# Patient Record
Sex: Female | Born: 1935
Health system: Southern US, Community
[De-identification: ages and names within clinical notes are randomized; demographics above are authoritative.]

## PROBLEM LIST (undated history)

## (undated) DIAGNOSIS — I10 Essential (primary) hypertension: Secondary | ICD-10-CM

## (undated) HISTORY — PX: BACK SURGERY: SHX140

## (undated) HISTORY — PX: TONSILLECTOMY: SUR1361

## (undated) HISTORY — PX: ABDOMINAL HYSTERECTOMY: SHX81

## (undated) HISTORY — PX: ABDOMINAL SURGERY: SHX537

## (undated) HISTORY — PX: CHOLECYSTECTOMY: SHX55

---

## 2016-10-09 DIAGNOSIS — I639 Cerebral infarction, unspecified: Secondary | ICD-10-CM

## 2016-10-09 HISTORY — DX: Cerebral infarction, unspecified: I63.9

## 2020-02-25 ENCOUNTER — Other Ambulatory Visit: Payer: Self-pay

## 2020-02-25 ENCOUNTER — Emergency Department (HOSPITAL_BASED_OUTPATIENT_CLINIC_OR_DEPARTMENT_OTHER): Payer: Medicare Other

## 2020-02-25 ENCOUNTER — Emergency Department (HOSPITAL_BASED_OUTPATIENT_CLINIC_OR_DEPARTMENT_OTHER)
Admission: EM | Admit: 2020-02-25 | Discharge: 2020-02-25 | Disposition: A | Discharge status: Home / Self Care | Payer: Medicare Other | Attending: Emergency Medicine | Admitting: Emergency Medicine

## 2020-02-25 ENCOUNTER — Encounter (HOSPITAL_BASED_OUTPATIENT_CLINIC_OR_DEPARTMENT_OTHER): Payer: Self-pay | Admitting: Emergency Medicine

## 2020-02-25 DIAGNOSIS — Z8673 Personal history of transient ischemic attack (TIA), and cerebral infarction without residual deficits: Secondary | ICD-10-CM | POA: Insufficient documentation

## 2020-02-25 DIAGNOSIS — Z79899 Other long term (current) drug therapy: Secondary | ICD-10-CM | POA: Diagnosis not present

## 2020-02-25 DIAGNOSIS — Z7902 Long term (current) use of antithrombotics/antiplatelets: Secondary | ICD-10-CM | POA: Diagnosis not present

## 2020-02-25 DIAGNOSIS — Z20822 Contact with and (suspected) exposure to covid-19: Secondary | ICD-10-CM | POA: Insufficient documentation

## 2020-02-25 DIAGNOSIS — R1031 Right lower quadrant pain: Secondary | ICD-10-CM | POA: Insufficient documentation

## 2020-02-25 DIAGNOSIS — Z880 Allergy status to penicillin: Secondary | ICD-10-CM | POA: Insufficient documentation

## 2020-02-25 DIAGNOSIS — J209 Acute bronchitis, unspecified: Secondary | ICD-10-CM | POA: Diagnosis not present

## 2020-02-25 DIAGNOSIS — R0602 Shortness of breath: Secondary | ICD-10-CM | POA: Diagnosis present

## 2020-02-25 DIAGNOSIS — I10 Essential (primary) hypertension: Secondary | ICD-10-CM | POA: Insufficient documentation

## 2020-02-25 HISTORY — DX: Essential (primary) hypertension: I10

## 2020-02-25 LAB — LACTIC ACID, PLASMA: Lactic Acid, Venous: 1.8 mmol/L (ref 0.5–1.9)

## 2020-02-25 LAB — COMPREHENSIVE METABOLIC PANEL
ALT: 15 U/L (ref 0–44)
AST: 19 U/L (ref 15–41)
Albumin: 3.8 g/dL (ref 3.5–5.0)
Alkaline Phosphatase: 68 U/L (ref 38–126)
Anion gap: 11 (ref 5–15)
BUN: 14 mg/dL (ref 8–23)
CO2: 21 mmol/L — ABNORMAL LOW (ref 22–32)
Calcium: 9.2 mg/dL (ref 8.9–10.3)
Chloride: 104 mmol/L (ref 98–111)
Creatinine, Ser: 0.65 mg/dL (ref 0.44–1.00)
GFR calc Af Amer: >60 mL/min (ref >60)
GFR calc non Af Amer: >60 mL/min (ref >60)
Glucose, Bld: 141 mg/dL — ABNORMAL HIGH (ref 70–99)
Potassium: 3.8 mmol/L (ref 3.5–5.1)
Sodium: 136 mmol/L (ref 135–145)
Total Bilirubin: 0.5 mg/dL (ref 0.3–1.2)
Total Protein: 6.7 g/dL (ref 6.5–8.1)

## 2020-02-25 LAB — CBC WITH DIFFERENTIAL/PLATELET
Abs Immature Granulocytes: 0.04 10*3/uL (ref 0.00–0.07)
Basophils Absolute: 0.1 10*3/uL (ref 0.0–0.1)
Basophils Relative: 1 %
Eosinophils Absolute: 0.3 10*3/uL (ref 0.0–0.5)
Eosinophils Relative: 3 %
HCT: 42.8 % (ref 36.0–46.0)
Hemoglobin: 14.1 g/dL (ref 12.0–15.0)
Immature Granulocytes: 0 %
Lymphocytes Relative: 19 %
Lymphs Abs: 1.9 10*3/uL (ref 0.7–4.0)
MCH: 30.5 pg (ref 26.0–34.0)
MCHC: 32.9 g/dL (ref 30.0–36.0)
MCV: 92.4 fL (ref 80.0–100.0)
Monocytes Absolute: 0.7 10*3/uL (ref 0.1–1.0)
Monocytes Relative: 7 %
Neutro Abs: 7.1 10*3/uL (ref 1.7–7.7)
Neutrophils Relative %: 70 %
Platelets: 308 10*3/uL (ref 150–400)
RBC: 4.63 MIL/uL (ref 3.87–5.11)
RDW: 13.1 % (ref 11.5–15.5)
WBC: 10 10*3/uL (ref 4.0–10.5)
nRBC: 0 % (ref 0.0–0.2)

## 2020-02-25 LAB — URINALYSIS, MICROSCOPIC (REFLEX)

## 2020-02-25 LAB — TROPONIN I (HIGH SENSITIVITY)
Troponin I (High Sensitivity): 5 ng/L (ref <18)
Troponin I (High Sensitivity): 5 ng/L (ref <18)

## 2020-02-25 LAB — URINALYSIS, ROUTINE W REFLEX MICROSCOPIC
Bilirubin Urine: NEGATIVE
Glucose, UA: NEGATIVE mg/dL
Hgb urine dipstick: NEGATIVE
Ketones, ur: NEGATIVE mg/dL
Nitrite: NEGATIVE
Protein, ur: NEGATIVE mg/dL
Specific Gravity, Urine: 1.015 (ref 1.005–1.030)
pH: 8 (ref 5.0–8.0)

## 2020-02-25 LAB — LIPASE, BLOOD: Lipase: 29 U/L (ref 11–51)

## 2020-02-25 LAB — SARS CORONAVIRUS 2 BY RT PCR (HOSPITAL ORDER, PERFORMED IN ~~LOC~~ HOSPITAL LAB): SARS Coronavirus 2: NEGATIVE

## 2020-02-25 MED ORDER — AZITHROMYCIN 250 MG PO TABS
500.0000 mg | ORAL_TABLET | Freq: Once | ORAL | Status: AC
  Administered 2020-02-25: 500 mg via ORAL
  Filled 2020-02-25: qty 2

## 2020-02-25 MED ORDER — ACETAMINOPHEN 325 MG PO TABS
650.0000 mg | ORAL_TABLET | Freq: Once | ORAL | Status: AC
  Administered 2020-02-25: 650 mg via ORAL
  Filled 2020-02-25: qty 2

## 2020-02-25 MED ORDER — ALBUTEROL SULFATE HFA 108 (90 BASE) MCG/ACT IN AERS
1.0000 | INHALATION_SPRAY | RESPIRATORY_TRACT | Status: DC | PRN
  Filled 2020-02-25: qty 6.7

## 2020-02-25 MED ORDER — AZITHROMYCIN 250 MG PO TABS
250.0000 mg | ORAL_TABLET | Freq: Every day | ORAL | 0 refills | Status: AC
Start: 2020-02-26 — End: 2020-03-01

## 2020-02-25 MED ORDER — IOHEXOL 350 MG/ML SOLN
100.0000 mL | Freq: Once | INTRAVENOUS | Status: AC | PRN
  Administered 2020-02-25: 100 mL via INTRAVENOUS

## 2020-02-25 MED FILL — AZITHROMYCIN 250 MG TABS: 250 | 4 days supply | Qty: 4 | Fill #0

## 2020-02-25 NOTE — ED Triage Notes (Signed)
Pt here after waking up at 2am with RLQ abdominal pain, chills and SOB. She had a non-productive cough yesterday and had a bowel movement this morning that was normal. 2L O2 by fire for comfort.

## 2020-02-25 NOTE — Discharge Instructions (Addendum)
You had a CT scan performed in the Emergency Department today that showed incidental findings.  Please follow up with your family doctor for recheck and let them know you had a scan performed on your visit today.  Get rechecked immediately if you have difficulty breathing, uncontrolled pain, or new concerning symptoms.     IMPRESSION: 1. No CT findings for pulmonary embolism. 2. Moderate tortuosity, ectasia and calcification of the thoracic aorta and branch vessels but no aneurysm or dissection. 3. Mild chronic appearing bronchitic type interstitial lung changes but no acute pulmonary findings. No worrisome pulmonary lesions. 4. Intra and extrahepatic biliary dilatation without obvious cause. Could not exclude a distal common bile duct stone or small ampullary lesion. This may just be related to the patient's age and status post cholecystectomy. Recommend correlation with liver function studies. If these are abnormal, ERCP or MRCP may be helpful for further evaluation. 5. Severe diverticulosis involving the distal descending colon and sigmoid colon but no findings for acute diverticulitis. 6. Aortic atherosclerosis.

## 2020-02-25 NOTE — ED Provider Notes (Signed)
[facility] EMERGENCY DEPARTMENT Provider Note   CSN: [id number] Arrival date & time: 02/25/20  4166     History Chief Complaint  Patient presents with  . Shortness of Breath  . Abdominal Pain  . Chills    Linda Patterson is a 86 y.o. female.  The history is provided by the patient, the EMS personnel and a relative. No language interpreter was used.  Shortness of Breath Associated symptoms: abdominal pain   Abdominal Pain Associated symptoms: shortness of breath    Linda Patterson is a 86 y.o. female who presents to the Emergency Department complaining of short of breath and abdominal pain. She presents the emergency department by EMS. Yesterday she began to feel mild cough. Overnight she developed shortness of breath and feeling like she could not get a good breath. Early this morning she developed right lower quadrant abdominal pain, nausea, chills. Symptoms are severe in nature. The abdominal pain is waxing and waning and nonradiating. When she developed the shortness of breath last night she used a primatene mist inhaler multiple times. Her symptoms worsened and then she said again. No reports of fever, loss of taste or smell, vomiting, diarrhea, dysuria, constipation. No known COVID 19 exposures. She has a history of cervical fusion, otherwise no additional medical problems. She is in the [place] area visiting her daughter. She lives in [place] and has been in the region since March.    Past Medical History:  Diagnosis Date  . Hypertension   . Stroke [facility]) 2018    There are no problems to display for this patient.   Past Surgical History:  Procedure Laterality Date  . ABDOMINAL HYSTERECTOMY    . ABDOMINAL SURGERY    . BACK SURGERY    . CHOLECYSTECTOMY    . TONSILLECTOMY       OB History   No obstetric history on file.     History reviewed. No pertinent family history.  Social History   Tobacco Use  . Smoking status: Former  Smoker    Types: Cigarettes    Quit date: 1990    Years since quitting: 31.4  . Smokeless tobacco: Never Used  Substance Use Topics  . Alcohol use: Not Currently  . Drug use: Never    Home Medications Prior to Admission medications   Medication Sig Start Date End Date Taking? Authorizing Provider  clopidogrel (PLAVIX) 75 MG tablet Take 75 mg by mouth daily.   Yes [provider]  cyclobenzaprine (FLEXERIL) 5 MG tablet Take 5 mg by mouth daily.   Yes [provider]  EPINEPHrine Base (EPINEPHRINE MIST IN) Inhale 1 spray into the lungs 2 (two) times daily as needed.   Yes [provider]  mirtazapine (REMERON) 7.5 MG tablet Take 7.5 mg by mouth at bedtime.   Yes [provider]  nystatin (NYSTATIN) powder Apply 1 application topically 2 (two) times daily.   Yes [provider]  propranolol (INDERAL) 60 MG tablet Take 60 mg by mouth daily.   Yes [provider]  traMADol (ULTRAM) 50 MG tablet Take 50 mg by mouth daily. PRN   Yes [provider]  azithromycin (ZITHROMAX) 250 MG tablet Take 1 tablet (250 mg total) by mouth daily for 4 days. 02/26/20 03/01/20  Tilden Fossa, MD    Allergies    Penicillins  Review of Systems   Review of Systems  Respiratory: Positive for shortness of breath.   Gastrointestinal: Positive for abdominal pain.  All other systems reviewed  and are negative.   Physical Exam Updated Vital Signs BP 123/67   Pulse (!) 101   Temp 98.8 F (37.1 C) (Oral)   Resp (!) 22   Ht 5\' 3"  (1.6 m)   Wt 68.5 kg   SpO2 96%   BMI 26.75 kg/m   Physical Exam Vitals and nursing note reviewed.  Constitutional:      Appearance: She is well-developed.  HENT:     Head: Normocephalic and atraumatic.  Cardiovascular:     Rate and Rhythm: Normal rate and regular rhythm.     Heart sounds: No murmur.  Pulmonary:     Effort: Pulmonary effort is normal. No respiratory distress.     Breath sounds: Normal breath  sounds.     Comments: Tachypnea Abdominal:     Palpations: Abdomen is soft.     Tenderness: There is no guarding or rebound.     Comments: Mild right lower quadrant abdominal tenderness without guarding or rebound  Musculoskeletal:        General: No swelling or tenderness.  Skin:    General: Skin is warm and dry.  Neurological:     Mental Status: She is alert and oriented to person, place, and time.  Psychiatric:        Behavior: Behavior normal.     ED Results / Procedures / Treatments   Labs (all labs ordered are listed, but only abnormal results are displayed) Labs Reviewed  COMPREHENSIVE METABOLIC PANEL - Abnormal; Notable for the following components:      Result Value   CO2 21 (*)    Glucose, Bld 141 (*)    All other components within normal limits  URINALYSIS, ROUTINE W REFLEX MICROSCOPIC - Abnormal; Notable for the following components:   Leukocytes,Ua MODERATE (*)    All other components within normal limits  URINALYSIS, MICROSCOPIC (REFLEX) - Abnormal; Notable for the following components:   Bacteria, UA FEW (*)    All other components within normal limits  SARS CORONAVIRUS 2 BY RT PCR (HOSPITAL ORDER, PERFORMED IN [facility])  URINE CULTURE  LIPASE, BLOOD  CBC WITH DIFFERENTIAL/PLATELET  LACTIC ACID, PLASMA  TROPONIN I (HIGH SENSITIVITY)  TROPONIN I (HIGH SENSITIVITY)    EKG EKG Interpretation  Date/Time:  Wednesday Feb 25 2020 07:35:54 EDT Ventricular Rate:  83 PR Interval:    QRS Duration: 82 QT Interval:  363 QTC Calculation: 427 R Axis:   41 Text Interpretation: Sinus rhythm no prior available for comparison Confirmed by 08[birth year]1980 [phone]) on 02/25/2020 8:06:17 AM   Radiology CT Angio Chest PE W/Cm &/Or Wo Cm  Result Date: 02/25/2020 CLINICAL DATA:  Shortness of breath, chest pain, cough and chills. EXAM: CT ANGIOGRAPHY CHEST CT ABDOMEN AND PELVIS WITH CONTRAST TECHNIQUE: Multidetector CT imaging of the chest was performed  using the standard protocol during bolus administration of intravenous contrast. Multiplanar CT image reconstructions and MIPs were obtained to evaluate the vascular anatomy. Multidetector CT imaging of the abdomen and pelvis was performed using the standard protocol during bolus administration of intravenous contrast. CONTRAST:  02/27/2020 OMNIPAQUE IOHEXOL 350 MG/ML SOLN COMPARISON:  None. FINDINGS: CTA CHEST FINDINGS Cardiovascular: The heart is normal in size. No pericardial effusion. There is moderate tortuosity, ectasia and calcification of the thoracic aorta. No dissection. The branch vessels are patent. Scattered three-vessel coronary artery calcifications are noted. The pulmonary arterial tree is fairly well opacified. No filling defects to suggest pulmonary embolism. Mediastinum/Nodes: No mediastinal or hilar mass or adenopathy. Small  scattered lymph nodes are noted. The esophagus is grossly normal. Small hiatal hernia. Lungs/Pleura: Mild chronic appearing bronchitic type interstitial lung changes. A few patchy areas of atelectasis and scarring are noted. No focal confluent airspace consolidation to suggest pneumonia. No worrisome pulmonary lesions. No pleural effusion or pulmonary edema. Musculoskeletal: No chest wall mass, breast lesions, supraclavicular or axillary adenopathy. The bony thorax is intact. Moderate degenerative changes involving the thoracic spine and age related osteoporosis. Review of the MIP images confirms the above findings. CT ABDOMEN and PELVIS FINDINGS Hepatobiliary: There is mild peripheral and moderate central intrahepatic biliary dilatation. The common bile duct is also dilated measuring a maximum of 15.5 mm in the porta hepatis and 11 mm in the head of the pancreas. It appears to terminate abruptly just before the ampulla. Could not exclude a distal common bile duct stone or small ampullary lesion. No pancreatic head mass is identified in the pancreatic duct is normal. Recommend  correlation with liver function studies. The gallbladder is surgically absent. Cystic duct remnant noted. Pancreas: No mass, inflammation or ductal dilatation. Spleen: Normal size. No focal lesions. Adrenals/Urinary Tract: The adrenal glands and kidneys are unremarkable. No worrisome renal lesions or hydronephrosis. The bladder is unremarkable. Stomach/Bowel: The stomach, duodenum, small bowel and colon are grossly normal without oral contrast. No acute inflammatory changes, mass lesions or obstructive findings. The terminal ileum is normal. The appendix is not identified but I do not see any findings suspicious for appendicitis. Severe diverticulosis involving the distal descending colon and sigmoid colon but no findings for acute diverticulitis. Vascular/Lymphatic: Moderate atherosclerotic calcifications involving the aorta and branch vessels but no aneurysm or dissection. The major venous structures are patent. No mesenteric or retroperitoneal mass or adenopathy. Reproductive: Surgically absent. A pessary ring is noted in the vagina. Other: No pelvic mass or adenopathy. No free pelvic fluid collections. No inguinal mass or adenopathy. No abdominal wall hernia or subcutaneous lesions. Musculoskeletal: No significant bony findings. Osteoporosis and degenerative changes involving the spine. Remote back surgery is noted. There appears to be remote myelographic material in the thecal sac distally. Review of the MIP images confirms the above findings. IMPRESSION: 1. No CT findings for pulmonary embolism. 2. Moderate tortuosity, ectasia and calcification of the thoracic aorta and branch vessels but no aneurysm or dissection. 3. Mild chronic appearing bronchitic type interstitial lung changes but no acute pulmonary findings. No worrisome pulmonary lesions. 4. Intra and extrahepatic biliary dilatation without obvious cause. Could not exclude a distal common bile duct stone or small ampullary lesion. This may just be  related to the patient's age and status post cholecystectomy. Recommend correlation with liver function studies. If these are abnormal, ERCP or MRCP may be helpful for further evaluation. 5. Severe diverticulosis involving the distal descending colon and sigmoid colon but no findings for acute diverticulitis. 6. Aortic atherosclerosis. Aortic Atherosclerosis (ICD10-I70.0). Electronically Signed   By: Rudie MeyerP.  Gallerani M.D.   On: 02/25/2020 10:49   CT Abdomen Pelvis W Contrast  Result Date: 02/25/2020 CLINICAL DATA:  Shortness of breath, chest pain, cough and chills. EXAM: CT ANGIOGRAPHY CHEST CT ABDOMEN AND PELVIS WITH CONTRAST TECHNIQUE: Multidetector CT imaging of the chest was performed using the standard protocol during bolus administration of intravenous contrast. Multiplanar CT image reconstructions and MIPs were obtained to evaluate the vascular anatomy. Multidetector CT imaging of the abdomen and pelvis was performed using the standard protocol during bolus administration of intravenous contrast. CONTRAST:  100mL OMNIPAQUE IOHEXOL 350 MG/ML SOLN COMPARISON:  None.  FINDINGS: CTA CHEST FINDINGS Cardiovascular: The heart is normal in size. No pericardial effusion. There is moderate tortuosity, ectasia and calcification of the thoracic aorta. No dissection. The branch vessels are patent. Scattered three-vessel coronary artery calcifications are noted. The pulmonary arterial tree is fairly well opacified. No filling defects to suggest pulmonary embolism. Mediastinum/Nodes: No mediastinal or hilar mass or adenopathy. Small scattered lymph nodes are noted. The esophagus is grossly normal. Small hiatal hernia. Lungs/Pleura: Mild chronic appearing bronchitic type interstitial lung changes. A few patchy areas of atelectasis and scarring are noted. No focal confluent airspace consolidation to suggest pneumonia. No worrisome pulmonary lesions. No pleural effusion or pulmonary edema. Musculoskeletal: No chest wall mass,  breast lesions, supraclavicular or axillary adenopathy. The bony thorax is intact. Moderate degenerative changes involving the thoracic spine and age related osteoporosis. Review of the MIP images confirms the above findings. CT ABDOMEN and PELVIS FINDINGS Hepatobiliary: There is mild peripheral and moderate central intrahepatic biliary dilatation. The common bile duct is also dilated measuring a maximum of 15.5 mm in the porta hepatis and 11 mm in the head of the pancreas. It appears to terminate abruptly just before the ampulla. Could not exclude a distal common bile duct stone or small ampullary lesion. No pancreatic head mass is identified in the pancreatic duct is normal. Recommend correlation with liver function studies. The gallbladder is surgically absent. Cystic duct remnant noted. Pancreas: No mass, inflammation or ductal dilatation. Spleen: Normal size. No focal lesions. Adrenals/Urinary Tract: The adrenal glands and kidneys are unremarkable. No worrisome renal lesions or hydronephrosis. The bladder is unremarkable. Stomach/Bowel: The stomach, duodenum, small bowel and colon are grossly normal without oral contrast. No acute inflammatory changes, mass lesions or obstructive findings. The terminal ileum is normal. The appendix is not identified but I do not see any findings suspicious for appendicitis. Severe diverticulosis involving the distal descending colon and sigmoid colon but no findings for acute diverticulitis. Vascular/Lymphatic: Moderate atherosclerotic calcifications involving the aorta and branch vessels but no aneurysm or dissection. The major venous structures are patent. No mesenteric or retroperitoneal mass or adenopathy. Reproductive: Surgically absent. A pessary ring is noted in the vagina. Other: No pelvic mass or adenopathy. No free pelvic fluid collections. No inguinal mass or adenopathy. No abdominal wall hernia or subcutaneous lesions. Musculoskeletal: No significant bony findings.  Osteoporosis and degenerative changes involving the spine. Remote back surgery is noted. There appears to be remote myelographic material in the thecal sac distally. Review of the MIP images confirms the above findings. IMPRESSION: 1. No CT findings for pulmonary embolism. 2. Moderate tortuosity, ectasia and calcification of the thoracic aorta and branch vessels but no aneurysm or dissection. 3. Mild chronic appearing bronchitic type interstitial lung changes but no acute pulmonary findings. No worrisome pulmonary lesions. 4. Intra and extrahepatic biliary dilatation without obvious cause. Could not exclude a distal common bile duct stone or small ampullary lesion. This may just be related to the patient's age and status post cholecystectomy. Recommend correlation with liver function studies. If these are abnormal, ERCP or MRCP may be helpful for further evaluation. 5. Severe diverticulosis involving the distal descending colon and sigmoid colon but no findings for acute diverticulitis. 6. Aortic atherosclerosis. Aortic Atherosclerosis (ICD10-I70.0). Electronically Signed   By: Rudie Meyer M.D.   On: 02/25/2020 10:49   DG Chest Port 1 View  Result Date: 02/25/2020 CLINICAL DATA:  Shortness of breath. EXAM: PORTABLE CHEST 1 VIEW COMPARISON:  None. FINDINGS: The cardiac silhouette, mediastinal and hilar  contours are within normal limits. Mild apical scarring type changes but no infiltrates, edema or effusions. Indeterminate rounded nodule at the right lung base. Comparison with any prior chest films would be helpful, if available. Otherwise, given the patient's age, I think a follow-up chest x-ray in 4 months would be reasonable to reassess. IMPRESSION: 1. No acute cardiopulmonary findings. 2. Indeterminate rounded nodule at the right lung base. Follow-up chest x-ray in 4 months is suggested. Electronically Signed   By: Marijo Sanes M.D.   On: 02/25/2020 08:08    Procedures Procedures (including critical  care time)  Medications Ordered in ED Medications  albuterol (VENTOLIN HFA) 108 (90 Base) MCG/ACT inhaler 1 puff (has no administration in time range)  acetaminophen (TYLENOL) tablet 650 mg (650 mg Oral Given 02/25/20 0843)  iohexol (OMNIPAQUE) 350 MG/ML injection 100 mL (100 mLs Intravenous Contrast Given 02/25/20 0955)  azithromycin (ZITHROMAX) tablet 500 mg (500 mg Oral Given 02/25/20 1217)    ED Course  I have reviewed the triage vital signs and the nursing notes.  Pertinent labs & imaging results that were available during my care of the patient were reviewed by me and considered in my medical decision making (see chart for details).    MDM Rules/Calculators/A&P                     Patient here for evaluation of shortness of breath, abdominal pain, chills. On initial presentation patient is anxious, tachypnea but without respiratory distress. CTA was obtained due to her tachypnea to rule out PE and CT abdomen pelvis obtained to rule out intra-abdominal infectious process given her pain. CT's are negative for PE, obstruction, abscess, pneumonia. Patient's tachypnea did improve with time in the emergency department. Imaging is significant for bronchitis. Given setting of patients chills, will treat with course of antibiotics. UA equivocal for UTI, will send a culture. COVID-19 swab is negative. Discussed with patient and daughter findings of studies at home treatment plan. Discussed outpatient follow-up and return precautions.  Also discussed incidental findings on patient's CT scan.    Linda Patterson was evaluated in Emergency Department on 02/25/2020 for the symptoms described in the history of present illness. She was evaluated in the context of the global COVID-19 pandemic, which necessitated consideration that the patient might be at risk for infection with the SARS-CoV-2 virus that causes COVID-19. Institutional protocols and algorithms that pertain to the evaluation of patients at  risk for COVID-19 are in a state of rapid change based on information released by regulatory bodies including the CDC and federal and state organizations. These policies and algorithms were followed during the patient's care in the ED.  Final Clinical Impression(s) / ED Diagnoses Final diagnoses:  Acute bronchitis, unspecified organism  Right lower quadrant abdominal pain    Rx / DC Orders ED Discharge Orders         Ordered    azithromycin (ZITHROMAX) 250 MG tablet  Daily     02/25/20 1153           Quintella Reichert, MD 02/25/20 1555

## 2020-02-26 LAB — URINE CULTURE: Culture: <10000 — AB

## 2020-03-07 ENCOUNTER — Emergency Department (HOSPITAL_BASED_OUTPATIENT_CLINIC_OR_DEPARTMENT_OTHER)
Admission: EM | Admit: 2020-03-07 | Discharge: 2020-03-07 | Disposition: A | Discharge status: Home / Self Care | Payer: Medicare Other | Attending: Emergency Medicine | Admitting: Emergency Medicine

## 2020-03-07 ENCOUNTER — Emergency Department (HOSPITAL_BASED_OUTPATIENT_CLINIC_OR_DEPARTMENT_OTHER): Payer: Medicare Other

## 2020-03-07 ENCOUNTER — Other Ambulatory Visit: Payer: Self-pay

## 2020-03-07 ENCOUNTER — Encounter (HOSPITAL_BASED_OUTPATIENT_CLINIC_OR_DEPARTMENT_OTHER): Payer: Self-pay | Admitting: Emergency Medicine

## 2020-03-07 DIAGNOSIS — I10 Essential (primary) hypertension: Secondary | ICD-10-CM | POA: Insufficient documentation

## 2020-03-07 DIAGNOSIS — R110 Nausea: Secondary | ICD-10-CM | POA: Insufficient documentation

## 2020-03-07 DIAGNOSIS — Z87891 Personal history of nicotine dependence: Secondary | ICD-10-CM | POA: Diagnosis not present

## 2020-03-07 DIAGNOSIS — Z20822 Contact with and (suspected) exposure to covid-19: Secondary | ICD-10-CM | POA: Insufficient documentation

## 2020-03-07 DIAGNOSIS — Z7902 Long term (current) use of antithrombotics/antiplatelets: Secondary | ICD-10-CM | POA: Diagnosis not present

## 2020-03-07 DIAGNOSIS — R0600 Dyspnea, unspecified: Secondary | ICD-10-CM | POA: Insufficient documentation

## 2020-03-07 DIAGNOSIS — Z79899 Other long term (current) drug therapy: Secondary | ICD-10-CM | POA: Diagnosis not present

## 2020-03-07 DIAGNOSIS — R109 Unspecified abdominal pain: Secondary | ICD-10-CM | POA: Diagnosis not present

## 2020-03-07 DIAGNOSIS — R0602 Shortness of breath: Secondary | ICD-10-CM | POA: Diagnosis present

## 2020-03-07 LAB — COMPREHENSIVE METABOLIC PANEL
ALT: 15 U/L (ref 0–44)
AST: 27 U/L (ref 15–41)
Albumin: 4.1 g/dL (ref 3.5–5.0)
Alkaline Phosphatase: 69 U/L (ref 38–126)
Anion gap: 13 (ref 5–15)
BUN: 11 mg/dL (ref 8–23)
CO2: 23 mmol/L (ref 22–32)
Calcium: 9.7 mg/dL (ref 8.9–10.3)
Chloride: 102 mmol/L (ref 98–111)
Creatinine, Ser: 0.68 mg/dL (ref 0.44–1.00)
GFR calc Af Amer: >60 mL/min (ref >60)
GFR calc non Af Amer: >60 mL/min (ref >60)
Glucose, Bld: 152 mg/dL — ABNORMAL HIGH (ref 70–99)
Potassium: 4.2 mmol/L (ref 3.5–5.1)
Sodium: 138 mmol/L (ref 135–145)
Total Bilirubin: 0.6 mg/dL (ref 0.3–1.2)
Total Protein: 7.2 g/dL (ref 6.5–8.1)

## 2020-03-07 LAB — CBC WITH DIFFERENTIAL/PLATELET
Abs Immature Granulocytes: 0.03 10*3/uL (ref 0.00–0.07)
Basophils Absolute: 0.1 10*3/uL (ref 0.0–0.1)
Basophils Relative: 1 %
Eosinophils Absolute: 0.3 10*3/uL (ref 0.0–0.5)
Eosinophils Relative: 4 %
HCT: 44.1 % (ref 36.0–46.0)
Hemoglobin: 14.6 g/dL (ref 12.0–15.0)
Immature Granulocytes: 0 %
Lymphocytes Relative: 26 %
Lymphs Abs: 2.2 10*3/uL (ref 0.7–4.0)
MCH: 30 pg (ref 26.0–34.0)
MCHC: 33.1 g/dL (ref 30.0–36.0)
MCV: 90.6 fL (ref 80.0–100.0)
Monocytes Absolute: 0.6 10*3/uL (ref 0.1–1.0)
Monocytes Relative: 7 %
Neutro Abs: 5.3 10*3/uL (ref 1.7–7.7)
Neutrophils Relative %: 62 %
Platelets: 293 10*3/uL (ref 150–400)
RBC: 4.87 MIL/uL (ref 3.87–5.11)
RDW: 12.9 % (ref 11.5–15.5)
WBC: 8.5 10*3/uL (ref 4.0–10.5)
nRBC: 0 % (ref 0.0–0.2)

## 2020-03-07 LAB — URINALYSIS, ROUTINE W REFLEX MICROSCOPIC
Bilirubin Urine: NEGATIVE
Glucose, UA: NEGATIVE mg/dL
Hgb urine dipstick: NEGATIVE
Ketones, ur: NEGATIVE mg/dL
Nitrite: NEGATIVE
Protein, ur: NEGATIVE mg/dL
Specific Gravity, Urine: 1.015 (ref 1.005–1.030)
pH: 8 (ref 5.0–8.0)

## 2020-03-07 LAB — SARS CORONAVIRUS 2 BY RT PCR (HOSPITAL ORDER, PERFORMED IN ~~LOC~~ HOSPITAL LAB): SARS Coronavirus 2: NEGATIVE

## 2020-03-07 LAB — URINALYSIS, MICROSCOPIC (REFLEX): RBC / HPF: NONE SEEN RBC/hpf (ref 0–5)

## 2020-03-07 LAB — TROPONIN I (HIGH SENSITIVITY)
Troponin I (High Sensitivity): 4 ng/L (ref <18)
Troponin I (High Sensitivity): 4 ng/L (ref <18)

## 2020-03-07 LAB — LIPASE, BLOOD: Lipase: 28 U/L (ref 11–51)

## 2020-03-07 LAB — BRAIN NATRIURETIC PEPTIDE: B Natriuretic Peptide: 10.8 pg/mL (ref 0.0–100.0)

## 2020-03-07 MED ORDER — ACETAMINOPHEN 500 MG PO TABS
1000.0000 mg | ORAL_TABLET | Freq: Once | ORAL | Status: AC
  Administered 2020-03-07: 1000 mg via ORAL
  Filled 2020-03-07: qty 2

## 2020-03-07 MED ORDER — ONDANSETRON 4 MG PO TBDP: ORAL_TABLET | ORAL | 0 refills | Status: AC

## 2020-03-07 NOTE — ED Provider Notes (Signed)
[facility] EMERGENCY DEPARTMENT Provider Note   CSN: [id number] Arrival date & time: 03/07/20  6073     History Chief Complaint  Patient presents with  . Shortness of Breath  . Abdominal Pain    Linda Patterson is a 86 y.o. female.  Patient presents with worsening shortness of breath, cough, nausea and abdominal discomfort.  Patient was seen recently in emergency room and evaluated for overall similar symptoms.  Her daughter felt that her symptoms became worse after using the inhaler and she got shaky more dyspneic.  The daughter had recent GI illness and now her mother has nausea and abdominal pain.        Past Medical History:  Diagnosis Date  . Hypertension   . Stroke [facility]) 2018    There are no problems to display for this patient.   Past Surgical History:  Procedure Laterality Date  . ABDOMINAL HYSTERECTOMY    . ABDOMINAL SURGERY    . BACK SURGERY    . CHOLECYSTECTOMY    . TONSILLECTOMY       OB History   No obstetric history on file.     No family history on file.  Social History   Tobacco Use  . Smoking status: Former Smoker    Types: Cigarettes    Quit date: 1990    Years since quitting: 31.4  . Smokeless tobacco: Never Used  Substance Use Topics  . Alcohol use: Not Currently  . Drug use: Never    Home Medications Prior to Admission medications   Medication Sig Start Date End Date Taking? Authorizing Provider  clopidogrel (PLAVIX) 75 MG tablet Take 75 mg by mouth daily.    [provider]  cyclobenzaprine (FLEXERIL) 5 MG tablet Take 5 mg by mouth daily.    [provider]  EPINEPHrine Base (EPINEPHRINE MIST IN) Inhale 1 spray into the lungs 2 (two) times daily as needed.    [provider]  mirtazapine (REMERON) 7.5 MG tablet Take 7.5 mg by mouth at bedtime.    [provider]  nystatin (NYSTATIN) powder Apply 1 application topically 2 (two) times daily.    [provider]    ondansetron (ZOFRAN ODT) 4 MG disintegrating tablet 4mg  ODT q4 hours prn nausea/vomit 03/07/20   03/09/20, MD  propranolol (INDERAL) 60 MG tablet Take 60 mg by mouth daily.    [provider]  traMADol (ULTRAM) 50 MG tablet Take 50 mg by mouth daily. PRN    [provider]    Allergies    Penicillins  Review of Systems   Review of Systems  Constitutional: Positive for appetite change. Negative for chills and fever.  HENT: Positive for congestion.   Eyes: Negative for visual disturbance.  Respiratory: Positive for cough and shortness of breath.   Cardiovascular: Negative for chest pain.  Gastrointestinal: Positive for abdominal pain and nausea. Negative for vomiting.  Genitourinary: Negative for dysuria and flank pain.  Musculoskeletal: Negative for back pain, neck pain and neck stiffness.  Skin: Negative for rash.  Neurological: Negative for light-headedness and headaches.    Physical Exam Updated Vital Signs BP (!) 117/57   Pulse 88   Temp 98.4 F (36.9 C) (Oral)   Resp (!) 24   Wt 67.1 kg   SpO2 92%   BMI 26.22 kg/m   Physical Exam Vitals and nursing note reviewed.  Constitutional:      Appearance: She is well-developed. She is not ill-appearing.  HENT:  Head: Normocephalic and atraumatic.     Comments: Dry mm, tired appearing Eyes:     General:        Right eye: No discharge.        Left eye: No discharge.     Conjunctiva/sclera: Conjunctivae normal.  Neck:     Trachea: No tracheal deviation.  Cardiovascular:     Rate and Rhythm: Normal rate and regular rhythm.  Pulmonary:     Effort: Pulmonary effort is normal.     Breath sounds: Examination of the right-lower field reveals rales. Examination of the left-lower field reveals rales. Rales present.  Abdominal:     General: There is no distension.     Palpations: Abdomen is soft.     Tenderness: There is abdominal tenderness (suprapubic). There is no guarding.  Musculoskeletal:      Cervical back: Normal range of motion and neck supple.     Right lower leg: No edema.     Left lower leg: No edema.  Skin:    General: Skin is warm.     Findings: No rash.  Neurological:     General: No focal deficit present.     Mental Status: She is alert and oriented to person, place, and time.  Psychiatric:        Mood and Affect: Mood normal.     ED Results / Procedures / Treatments   Labs (all labs ordered are listed, but only abnormal results are displayed) Labs Reviewed  COMPREHENSIVE METABOLIC PANEL - Abnormal; Notable for the following components:      Result Value   Glucose, Bld 152 (*)    All other components within normal limits  URINALYSIS, ROUTINE W REFLEX MICROSCOPIC - Abnormal; Notable for the following components:   Leukocytes,Ua MODERATE (*)    All other components within normal limits  URINALYSIS, MICROSCOPIC (REFLEX) - Abnormal; Notable for the following components:   Bacteria, UA FEW (*)    All other components within normal limits  SARS CORONAVIRUS 2 BY RT PCR (HOSPITAL ORDER, PERFORMED IN [facility])  CBC WITH DIFFERENTIAL/PLATELET  BRAIN NATRIURETIC PEPTIDE  LIPASE, BLOOD  TROPONIN I (HIGH SENSITIVITY)  TROPONIN I (HIGH SENSITIVITY)    EKG EKG Interpretation  Date/Time:  Sunday Mar 07 2020 08:50:04 EDT Ventricular Rate:  93 PR Interval:    QRS Duration: 83 QT Interval:  348 QTC Calculation: 433 R Axis:   43 Text Interpretation: Sinus rhythm Confirmed by Blane Ohara [phone]) on 03/07/2020 9:00:08 AM   Radiology DG Chest Portable 1 View  Result Date: 03/07/2020 CLINICAL DATA:  Cough and shortness of breath beginning yesterday. EXAM: PORTABLE CHEST 1 VIEW COMPARISON:  Chest radiograph and abdomen CT on 03/06/2020 FINDINGS: The heart size and mediastinal contours are within normal limits. Both lungs are clear. IMPRESSION: No active disease. Electronically Signed   By: Danae Orleans M.D.   On: 03/07/2020 09:41     Procedures Procedures (including critical care time)  Medications Ordered in ED Medications  acetaminophen (TYLENOL) tablet 1,000 mg (1,000 mg Oral Given 03/07/20 1205)    ED Course  I have reviewed the triage vital signs and the nursing notes.  Pertinent labs & imaging results that were available during my care of the patient were reviewed by me and considered in my medical decision making (see chart for details).    MDM Rules/Calculators/A&P                     Elderly patient  presents with worsening symptoms with broad differential diagnosis including viral respiratory infection/Covid, gastritis, heart failure, other.  Plan for blood work to check for signs of anemia, electrolyte abnormalities, kidney dysfunction, urine infection and signs of heart strain.  Chest x-ray ordered and pending, Covid test repeated.  EKG reviewed no ischemic findings.  Recent CT scans reviewed significant diverticulosis however no acute abnormalities.  Blood work reviewed overall unremarkable normal white blood cell count, normal hemoglobin, normal BNP, normal troponin.  Patient well-appearing on reassessment sitting up comfortable, normal work of breathing, tolerated oral fluids.  Patient stable for outpatient follow-up.  Discussed reasons to return.  COVID negative.  Linda Patterson was evaluated in Emergency Department on 03/07/2020 for the symptoms described in the history of present illness. She was evaluated in the context of the global COVID-19 pandemic, which necessitated consideration that the patient might be at risk for infection with the SARS-CoV-2 virus that causes COVID-19. Institutional protocols and algorithms that pertain to the evaluation of patients at risk for COVID-19 are in a state of rapid change based on information released by regulatory bodies including the CDC and federal and state organizations. These policies and algorithms were followed during the patient's care in the  ED.  Final Clinical Impression(s) / ED Diagnoses Final diagnoses:  Nausea  Dyspnea, unspecified type    Rx / DC Orders ED Discharge Orders         Ordered    ondansetron (ZOFRAN ODT) 4 MG disintegrating tablet     03/07/20 1143           Elnora Morrison, MD 03/07/20 1249

## 2020-03-07 NOTE — ED Triage Notes (Signed)
Pt c/o SOB and cough since yesterday. She started using her inhaler from her last visit. She has also had a stomach bug for the last few days. C/o abd pain. Her daughter states she became shaky after using the inhaler and thinks she is having a reaction.

## 2020-03-07 NOTE — Discharge Instructions (Signed)
Use Tylenol every 4 hours as needed for pain or fevers. Use Zofran as needed for nausea and vomiting. Return for worsening shortness of breath, chest pain or new concerns.  Follow-up with a local doctor.

## 2022-02-11 IMAGING — DX DG CHEST 1V PORT
1 series · 1 of 1 positions shown · non-contrast
Comparison: None.

CLINICAL DATA: Shortness of breath.

EXAM:
PORTABLE CHEST 1 VIEW

[chest ap]
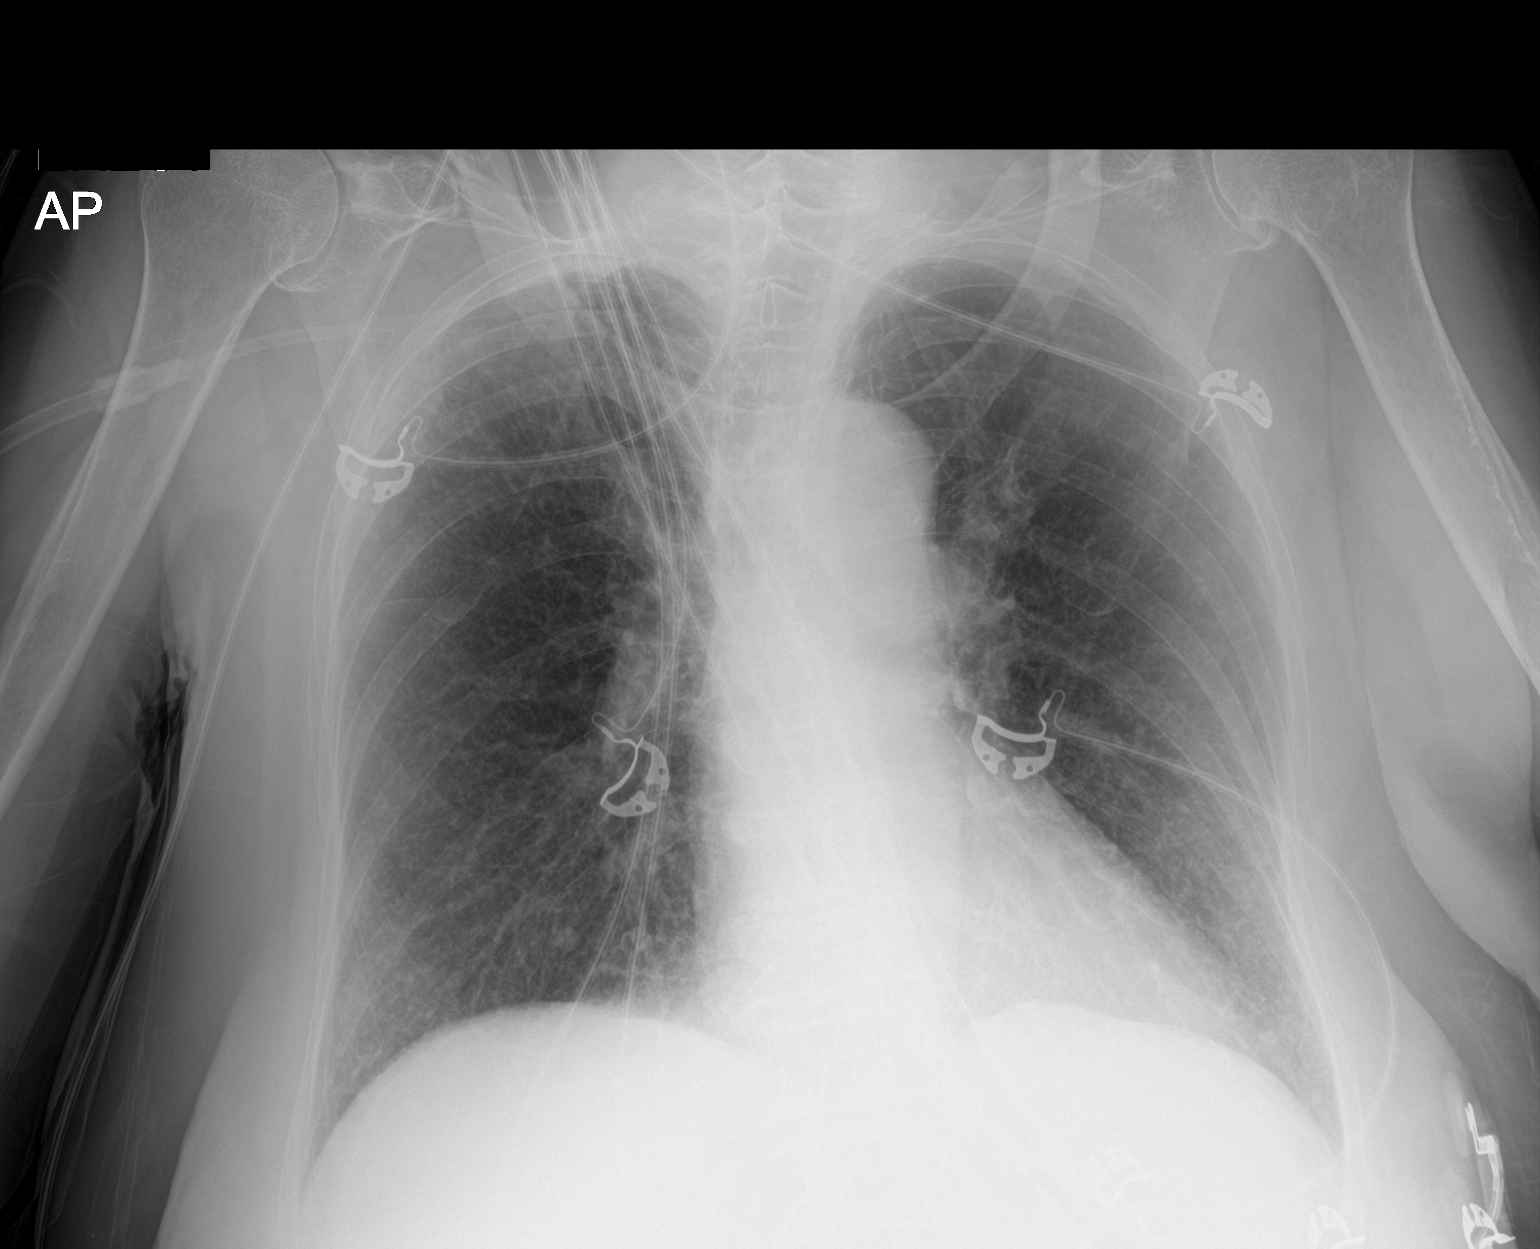

[1 of 1 positions shown; findings below may reference images not displayed]

FINDINGS: The cardiac silhouette, mediastinal and hilar contours are within
normal limits. Mild apical scarring type changes but no infiltrates,
edema or effusions. Indeterminate rounded nodule at the right lung
base. Comparison with any prior chest films would be helpful, if
available. Otherwise, given the patient's age, I think a follow-up
chest x-ray in 4 months would be reasonable to reassess.
IMPRESSION: 1. No acute cardiopulmonary findings.
2. Indeterminate rounded nodule at the right lung base. Follow-up
chest x-ray in 4 months is suggested.

## 2022-02-11 IMAGING — CT CT ABD-PELV W/ CM
2 of 5 series · 14 of 46 positions shown, 16 images · IV contrast (Omnipaque)
Comparison: None.

CLINICAL DATA: Shortness of breath, chest pain, cough and chills.

EXAM:
CT ANGIOGRAPHY CHEST
CT ABDOMEN AND PELVIS WITH CONTRAST
TECHNIQUE: Multidetector CT imaging of the chest was performed using the
standard protocol during bolus administration of intravenous
contrast. Multiplanar CT image reconstructions and MIPs were
obtained to evaluate the vascular anatomy. Multidetector CT imaging
of the abdomen and pelvis was performed using the standard protocol
during bolus administration of intravenous contrast.
CONTRAST:  100mL OMNIPAQUE IOHEXOL 350 MG/ML SOLN

[Series 7: axial st · axial · 0.90mm/px · z∈[-569,-194]mm · 11 of 85 slices shown, 13 images]
[im 5/85  soft-tissue]
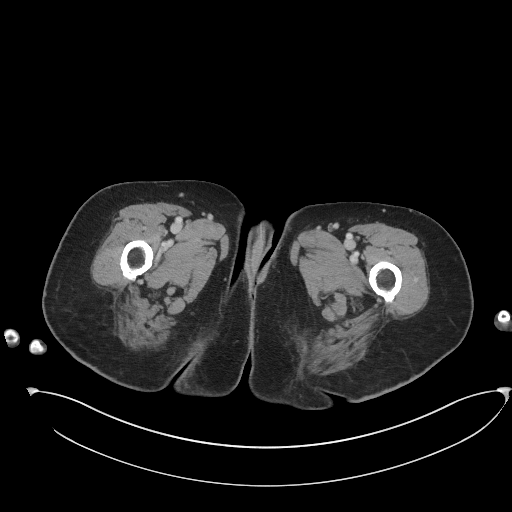
[im 5/85  bone]
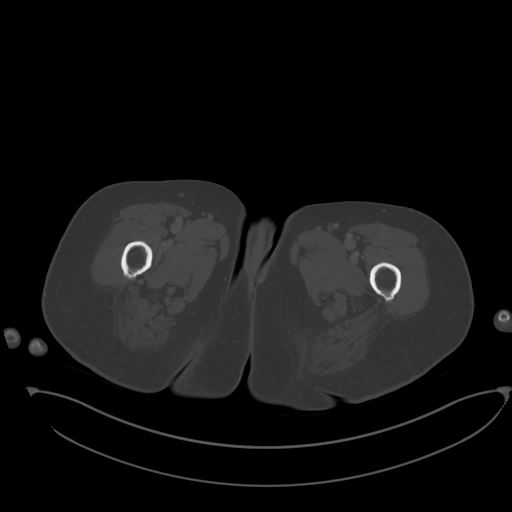
[im 13/85  soft-tissue]
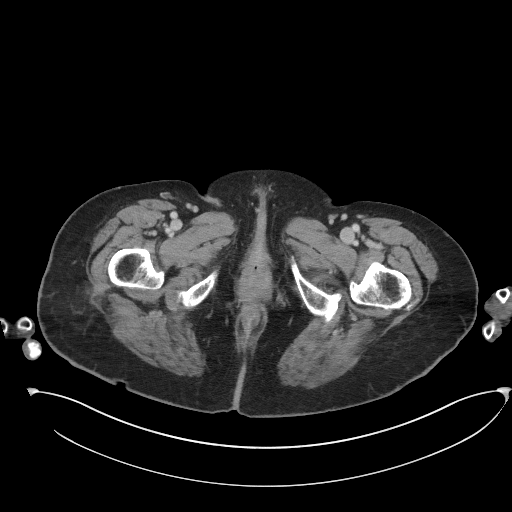
[im 22/85  soft-tissue]
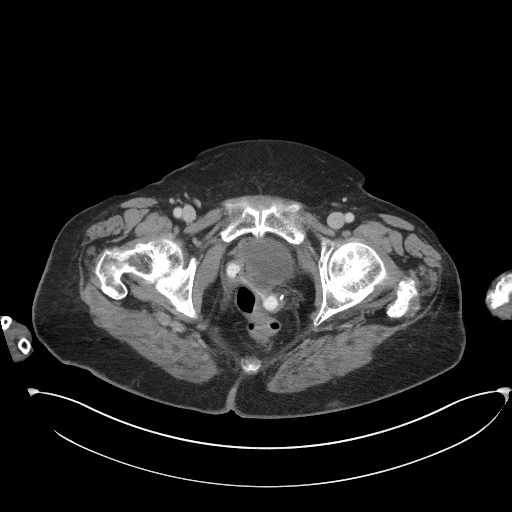
[im 30/85  soft-tissue]
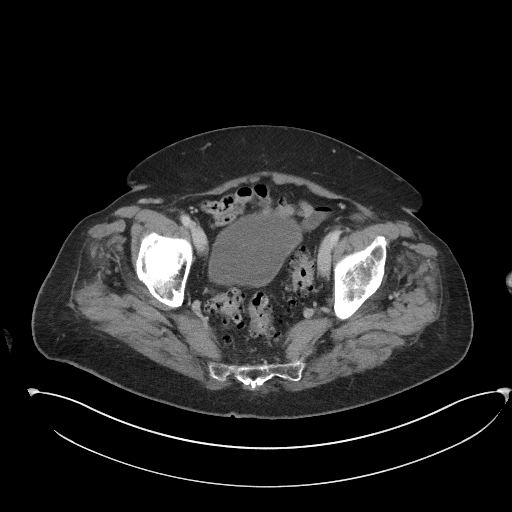
[im 34/85  soft-tissue]
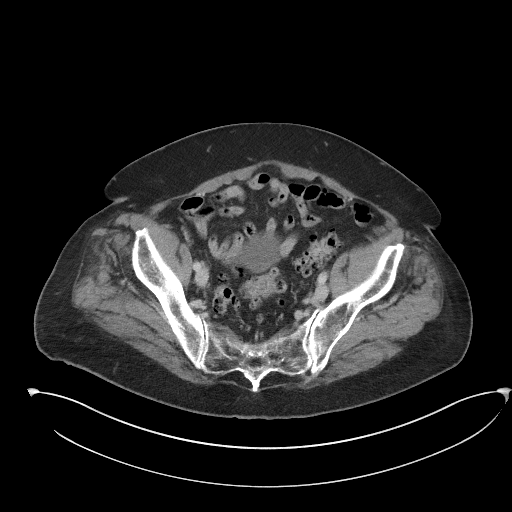
[im 43/85  soft-tissue]
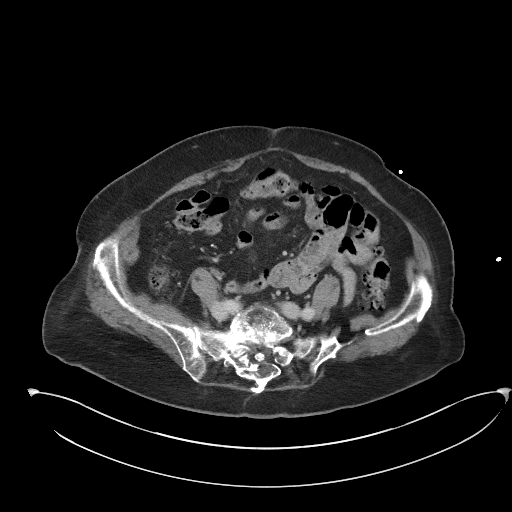
[im 51/85  soft-tissue]
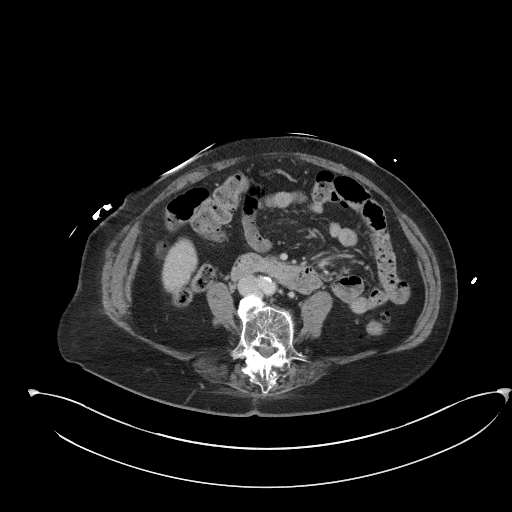
[im 55/85  soft-tissue]
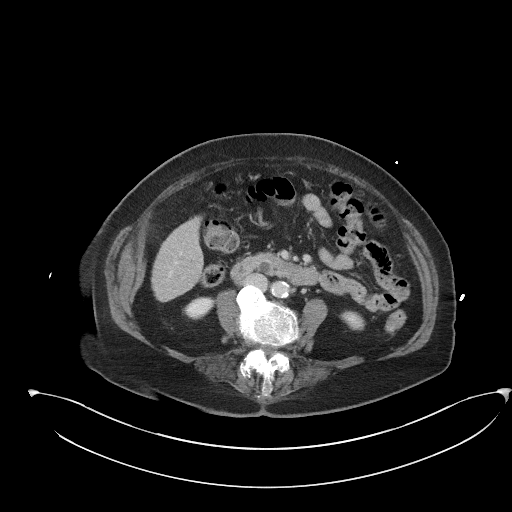
[im 64/85  soft-tissue]
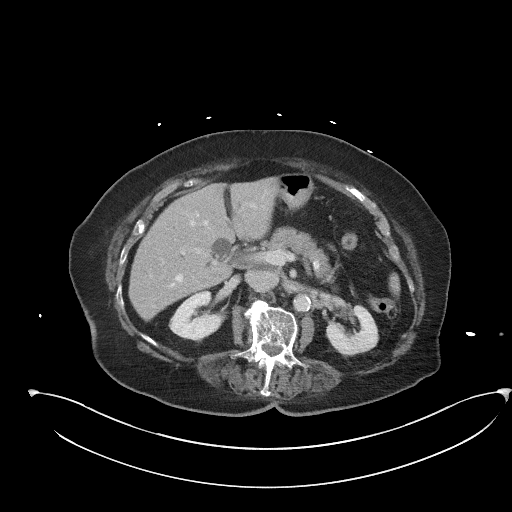
[im 64/85  bone]
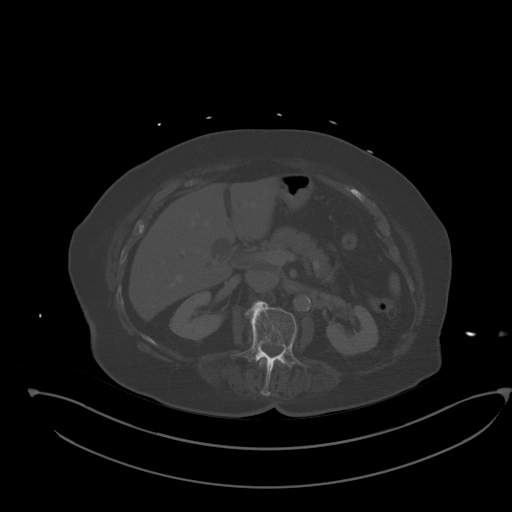
[im 72/85  soft-tissue]
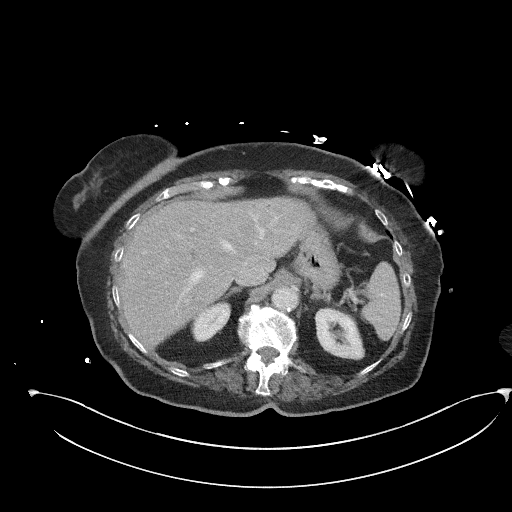
[im 80/85  soft-tissue]
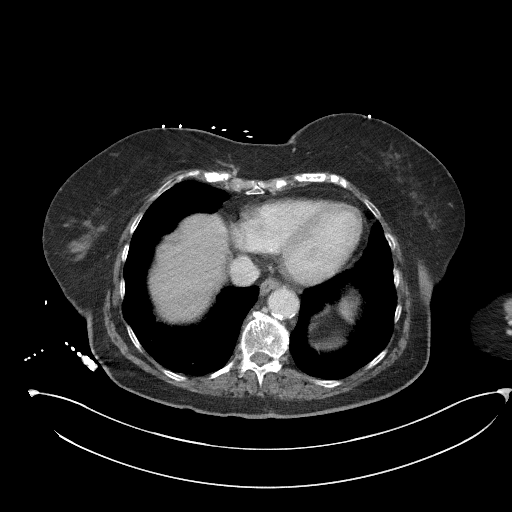

[Series 16: abd/pel coronal st · coronal · 0.81mm/px · 3 of 90 slices shown]
[im 30/90  soft-tissue]
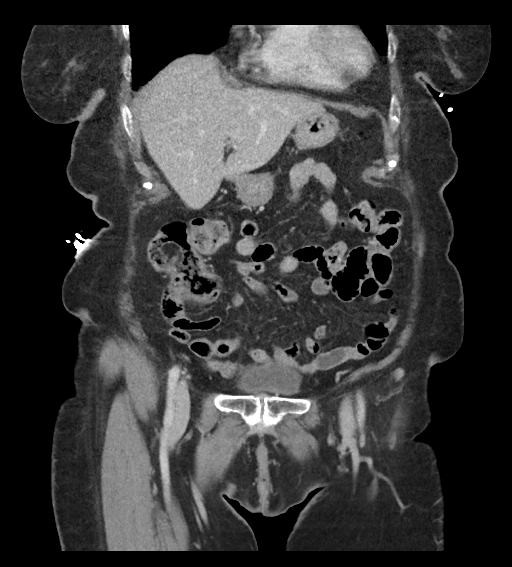
[im 40/90  soft-tissue]
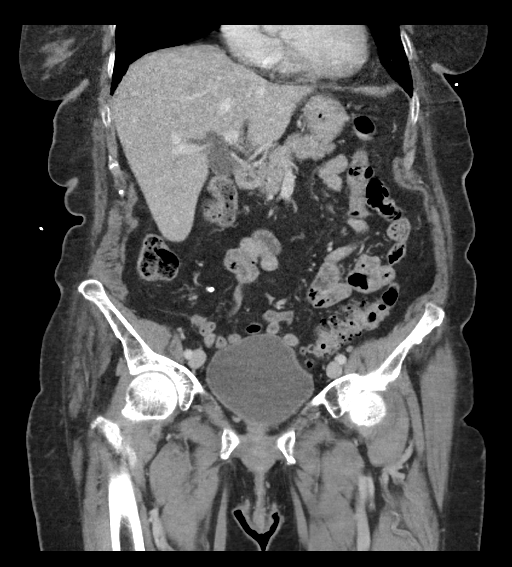
[im 50/90  soft-tissue]
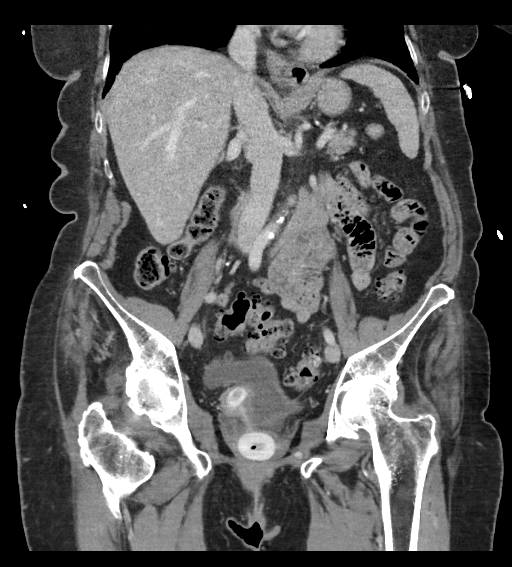

[14 of 46 positions shown; findings below may reference images not displayed]

FINDINGS: CTA CHEST FINDINGS

Cardiovascular: The heart is normal in size. No pericardial
effusion. There is moderate tortuosity, ectasia and calcification of
the thoracic aorta. No dissection. The branch vessels are patent.
Scattered three-vessel coronary artery calcifications are noted.

The pulmonary arterial tree is fairly well opacified. No filling
defects to suggest pulmonary embolism.

Mediastinum/Nodes: No mediastinal or hilar mass or adenopathy. Small
scattered lymph nodes are noted. The esophagus is grossly normal.
Small hiatal hernia.

Lungs/Pleura: Mild chronic appearing bronchitic type interstitial
lung changes. A few patchy areas of atelectasis and scarring are
noted. No focal confluent airspace consolidation to suggest
pneumonia. No worrisome pulmonary lesions. No pleural effusion or
pulmonary edema.

Musculoskeletal: No chest wall mass, breast lesions, supraclavicular
or axillary adenopathy. The bony thorax is intact. Moderate
degenerative changes involving the thoracic spine and age related
osteoporosis.

Review of the MIP images confirms the above findings.

CT ABDOMEN and PELVIS FINDINGS

Hepatobiliary: There is mild peripheral and moderate central
intrahepatic biliary dilatation. The common bile duct is also
dilated measuring a maximum of 15.5 mm in the porta hepatis and 11
mm in the head of the pancreas. It appears to terminate abruptly
just before the ampulla. Could not exclude a distal common bile duct
stone or small ampullary lesion. No pancreatic head mass is
identified in the pancreatic duct is normal. Recommend correlation
with liver function studies. The gallbladder is surgically absent.
Cystic duct remnant noted.

Pancreas: No mass, inflammation or ductal dilatation.

Spleen: Normal size. No focal lesions.

Adrenals/Urinary Tract: The adrenal glands and kidneys are
unremarkable. No worrisome renal lesions or hydronephrosis. The
bladder is unremarkable.

Stomach/Bowel: The stomach, duodenum, small bowel and colon are
grossly normal without oral contrast. No acute inflammatory changes,
mass lesions or obstructive findings. The terminal ileum is normal.
The appendix is not identified but I do not see any findings
suspicious for appendicitis.

Severe diverticulosis involving the distal descending colon and
sigmoid colon but no findings for acute diverticulitis.

Vascular/Lymphatic: Moderate atherosclerotic calcifications
involving the aorta and branch vessels but no aneurysm or
dissection. The major venous structures are patent. No mesenteric or
retroperitoneal mass or adenopathy.

Reproductive: Surgically absent. A pessary ring is noted in the
vagina.

Other: No pelvic mass or adenopathy. No free pelvic fluid
collections. No inguinal mass or adenopathy. No abdominal wall
hernia or subcutaneous lesions.

Musculoskeletal: No significant bony findings. Osteoporosis and
degenerative changes involving the spine. Remote back surgery is
noted. There appears to be remote myelographic material in the
thecal sac distally.

Review of the MIP images confirms the above findings.
IMPRESSION: 1. No CT findings for pulmonary embolism.
2. Moderate tortuosity, ectasia and calcification of the thoracic
aorta and branch vessels but no aneurysm or dissection.
3. Mild chronic appearing bronchitic type interstitial lung changes
but no acute pulmonary findings. No worrisome pulmonary lesions.
4. Intra and extrahepatic biliary dilatation without obvious cause.
Could not exclude a distal common bile duct stone or small ampullary
lesion. This may just be related to the patient's age and status
post cholecystectomy. Recommend correlation with liver function
studies. If these are abnormal, ERCP or MRCP may be helpful for
further evaluation.
5. Severe diverticulosis involving the distal descending colon and
sigmoid colon but no findings for acute diverticulitis.
6. Aortic atherosclerosis.

Aortic Atherosclerosis ([id number]).
